# Patient Record
Sex: Female | Born: 1980 | Race: Black or African American | Hispanic: No | Marital: Single | Smoking: Never smoker
Health system: Southern US, Community
[De-identification: ages and names within clinical notes are randomized; demographics above are authoritative.]

## PROBLEM LIST (undated history)

## (undated) ENCOUNTER — Inpatient Hospital Stay (HOSPITAL_COMMUNITY): Payer: Self-pay

## (undated) DIAGNOSIS — R87612 Low grade squamous intraepithelial lesion on cytologic smear of cervix (LGSIL): Secondary | ICD-10-CM

## (undated) DIAGNOSIS — N946 Dysmenorrhea, unspecified: Secondary | ICD-10-CM

## (undated) HISTORY — DX: Low grade squamous intraepithelial lesion on cytologic smear of cervix (LGSIL): R87.612

## (undated) HISTORY — DX: Dysmenorrhea, unspecified: N94.6

## (undated) HISTORY — PX: NO PAST SURGERIES: SHX2092

---

## 2003-04-19 ENCOUNTER — Other Ambulatory Visit: Admission: RE | Admit: 2003-04-19 | Discharge: 2003-04-19 | Payer: Self-pay | Admitting: Gynecology

## 2004-04-21 ENCOUNTER — Other Ambulatory Visit: Admission: RE | Admit: 2004-04-21 | Discharge: 2004-04-21 | Payer: Self-pay | Admitting: Gynecology

## 2005-06-16 ENCOUNTER — Emergency Department (HOSPITAL_COMMUNITY): Admission: EM | Admit: 2005-06-16 | Discharge: 2005-06-16 | Payer: Self-pay | Admitting: Emergency Medicine

## 2005-06-17 ENCOUNTER — Emergency Department (HOSPITAL_COMMUNITY): Admission: EM | Admit: 2005-06-17 | Discharge: 2005-06-17 | Payer: Self-pay | Admitting: Emergency Medicine

## 2006-02-12 ENCOUNTER — Other Ambulatory Visit: Admission: RE | Admit: 2006-02-12 | Discharge: 2006-02-12 | Payer: Self-pay | Admitting: Gynecology

## 2007-12-24 ENCOUNTER — Other Ambulatory Visit: Admission: RE | Admit: 2007-12-24 | Discharge: 2007-12-24 | Payer: Self-pay | Admitting: Gynecology

## 2009-01-19 ENCOUNTER — Encounter: Payer: Self-pay | Admitting: Women's Health

## 2009-01-19 ENCOUNTER — Other Ambulatory Visit: Admission: RE | Admit: 2009-01-19 | Discharge: 2009-01-19 | Payer: Self-pay | Admitting: Obstetrics and Gynecology

## 2009-01-19 ENCOUNTER — Ambulatory Visit: Payer: Self-pay | Admitting: Women's Health

## 2010-02-08 ENCOUNTER — Ambulatory Visit: Payer: Self-pay | Admitting: Women's Health

## 2010-02-08 ENCOUNTER — Other Ambulatory Visit: Admission: RE | Admit: 2010-02-08 | Discharge: 2010-02-08 | Payer: Self-pay | Admitting: Obstetrics and Gynecology

## 2011-04-26 ENCOUNTER — Encounter: Payer: Self-pay | Admitting: *Deleted

## 2011-04-27 ENCOUNTER — Other Ambulatory Visit (HOSPITAL_COMMUNITY)
Admission: RE | Admit: 2011-04-27 | Discharge: 2011-04-27 | Disposition: A | Discharge status: Home / Self Care | Payer: 59 | Source: Ambulatory Visit | Attending: Women's Health | Admitting: Women's Health

## 2011-04-27 ENCOUNTER — Ambulatory Visit (INDEPENDENT_AMBULATORY_CARE_PROVIDER_SITE_OTHER): Payer: 59 | Admitting: Women's Health

## 2011-04-27 ENCOUNTER — Encounter: Payer: Self-pay | Admitting: Women's Health

## 2011-04-27 VITALS — BP 114/72 | Ht 61.5 in | Wt 192.0 lb

## 2011-04-27 DIAGNOSIS — IMO0001 Reserved for inherently not codable concepts without codable children: Secondary | ICD-10-CM

## 2011-04-27 DIAGNOSIS — Z833 Family history of diabetes mellitus: Secondary | ICD-10-CM

## 2011-04-27 DIAGNOSIS — Z309 Encounter for contraceptive management, unspecified: Secondary | ICD-10-CM

## 2011-04-27 DIAGNOSIS — Z01419 Encounter for gynecological examination (general) (routine) without abnormal findings: Secondary | ICD-10-CM

## 2011-04-27 LAB — GLUCOSE, RANDOM: Glucose, Bld: 81 mg/dL (ref 70–99)

## 2011-04-27 MED ORDER — NORETHINDRONE ACET-ETHINYL EST 1-20 MG-MCG PO TABS: 1.0000 | ORAL_TABLET | Freq: Every day | ORAL | Status: DC

## 2011-04-27 NOTE — Progress Notes (Signed)
Courtney Powell July 21, 1980 [id number]    History:    The patient presents for annual exam.  Works at [employer] in [place] area.   Past medical history, past surgical history, family history and social history were all reviewed and documented in the EPIC chart.   ROS:  A  ROS was performed and pertinent positives and negatives are included in the history.  Exam:  Filed Vitals:   04/27/11 1444  BP: 114/72    General appearance:  Normal Head/Neck:  Normal, without cervical or supraclavicular adenopathy. Thyroid:  Symmetrical, normal in size, without palpable masses or nodularity. Respiratory  Effort:  Normal  Auscultation:  Clear without wheezing or rhonchi Cardiovascular  Auscultation:  Regular rate, without rubs, murmurs or gallops  Edema/varicosities:  Not grossly evident Abdominal  Soft,nontender, without masses, guarding or rebound.  Liver/spleen:  No organomegaly noted  Hernia:  None appreciated  Skin  Inspection:  Grossly normal  Palpation:  Grossly normal Neurologic/psychiatric  Orientation:  Normal with appropriate conversation.  Mood/affect:  Normal  Genitourinary    Breasts: Examined lying and sitting.     Right: Without masses, retractions, discharge or axillary adenopathy.     Left: Without masses, retractions, discharge or axillary adenopathy.   Inguinal/mons:  Normal without inguinal adenopathy  External genitalia:  Normal  BUS/Urethra/Skene's glands:  Normal  Bladder:  Normal  Vagina:  Normal  Cervix:  Normal  Uterus:   normal in size, shape and contour.  Midline and mobile  Adnexa/parametria:     Rt: Without masses or tenderness.   Lt: Without masses or tenderness.  Anus and perineum: Normal  Digital rectal exam: Normal sphincter tone without palpated masses or tenderness  Assessment/Plan:  30 y.o. SBFG0 for annual exam. Monthly 5 day cycle/condoms. Same partner. Gardasil completed in 08. History of normal Paps.  Normal GYN exam  Plan:  Had been on Loestrin 1/20 without problem,  ran out of Rx. Loestrin 1/20 prescription, proper use, slight risk for blood clots and strokes, condoms  first month and for infection control reviewed. SBEs, exercise, decrease calories for weight loss, calcium rich diet, MVI daily encouraged. CBC, UA and Pap. [name] [facility], 5:05 PM 04/27/2011

## 2014-06-21 ENCOUNTER — Encounter: Payer: Self-pay | Admitting: Women's Health

## 2014-06-21 ENCOUNTER — Ambulatory Visit (INDEPENDENT_AMBULATORY_CARE_PROVIDER_SITE_OTHER): Payer: 59 | Admitting: Women's Health

## 2014-06-21 ENCOUNTER — Other Ambulatory Visit (HOSPITAL_COMMUNITY)
Admission: RE | Admit: 2014-06-21 | Discharge: 2014-06-21 | Disposition: A | Discharge status: Home / Self Care | Payer: 59 | Source: Ambulatory Visit | Attending: Women's Health | Admitting: Women's Health

## 2014-06-21 VITALS — BP 114/70 | Ht 62.0 in | Wt 207.0 lb

## 2014-06-21 DIAGNOSIS — Z833 Family history of diabetes mellitus: Secondary | ICD-10-CM

## 2014-06-21 DIAGNOSIS — Z113 Encounter for screening for infections with a predominantly sexual mode of transmission: Secondary | ICD-10-CM

## 2014-06-21 DIAGNOSIS — A499 Bacterial infection, unspecified: Secondary | ICD-10-CM

## 2014-06-21 DIAGNOSIS — Z01419 Encounter for gynecological examination (general) (routine) without abnormal findings: Secondary | ICD-10-CM | POA: Insufficient documentation

## 2014-06-21 DIAGNOSIS — N760 Acute vaginitis: Secondary | ICD-10-CM

## 2014-06-21 DIAGNOSIS — B3731 Acute candidiasis of vulva and vagina: Secondary | ICD-10-CM

## 2014-06-21 DIAGNOSIS — B373 Candidiasis of vulva and vagina: Secondary | ICD-10-CM

## 2014-06-21 DIAGNOSIS — Z1151 Encounter for screening for human papillomavirus (HPV): Secondary | ICD-10-CM | POA: Diagnosis present

## 2014-06-21 DIAGNOSIS — B9689 Other specified bacterial agents as the cause of diseases classified elsewhere: Secondary | ICD-10-CM

## 2014-06-21 LAB — WET PREP FOR TRICH, YEAST, CLUE: Trich, Wet Prep: NONE SEEN

## 2014-06-21 LAB — CBC WITH DIFFERENTIAL/PLATELET
BASOS ABS: 0 10*3/uL (ref 0.0–0.1)
Basophils Relative: 0 % (ref 0–1)
Eosinophils Absolute: 0.2 10*3/uL (ref 0.0–0.7)
Eosinophils Relative: 4 % (ref 0–5)
HEMATOCRIT: 38.9 % (ref 36.0–46.0)
Hemoglobin: 12.8 g/dL (ref 12.0–15.0)
LYMPHS PCT: 24 % (ref 12–46)
Lymphs Abs: 1.3 10*3/uL (ref 0.7–4.0)
MCH: 25.9 pg — ABNORMAL LOW (ref 26.0–34.0)
MCHC: 32.9 g/dL (ref 30.0–36.0)
MCV: 78.6 fL (ref 78.0–100.0)
MONOS PCT: 6 % (ref 3–12)
MPV: 10 fL (ref 8.6–12.4)
Monocytes Absolute: 0.3 10*3/uL (ref 0.1–1.0)
NEUTROS ABS: 3.7 10*3/uL (ref 1.7–7.7)
NEUTROS PCT: 66 % (ref 43–77)
Platelets: 307 10*3/uL (ref 150–400)
RBC: 4.95 MIL/uL (ref 3.87–5.11)
RDW: 14 % (ref 11.5–15.5)
WBC: 5.6 10*3/uL (ref 4.0–10.5)

## 2014-06-21 LAB — RPR

## 2014-06-21 LAB — GLUCOSE, RANDOM: Glucose, Bld: 87 mg/dL (ref 70–99)

## 2014-06-21 MED ORDER — FLUCONAZOLE 150 MG PO TABS: 150.0000 mg | ORAL_TABLET | Freq: Once | ORAL | Status: DC

## 2014-06-21 MED ORDER — METRONIDAZOLE 0.75 % VA GEL: VAGINAL | Status: DC

## 2014-06-21 NOTE — Addendum Note (Signed)
Addended by: Dayna BarkerGARDNER, Mattingly Fountaine K on: 06/21/2014 10:56 AM   Modules accepted: Orders

## 2014-06-21 NOTE — Patient Instructions (Signed)

## 2014-06-21 NOTE — Progress Notes (Signed)
Courtney Powell 12-Feb-1981 [id number]    History:    Presents for annual exam.  Monthly cycle condoms inconsistently. New partner. Gardasil series completed. Normal Pap history. Last annual exam 2012.  Past medical history, past surgical history, family history and social history were all reviewed and documented in the EPIC chart. Works at [employer]. Parents healthy.  ROS:  A ROS was performed and pertinent positives and negatives are included.  Exam:  Filed Vitals:   06/21/14 0952  BP: 114/70    General appearance:  Normal Thyroid:  Symmetrical, normal in size, without palpable masses or nodularity. Respiratory  Auscultation:  Clear without wheezing or rhonchi Cardiovascular  Auscultation:  Regular rate, without rubs, murmurs or gallops  Edema/varicosities:  Not grossly evident Abdominal  Soft,nontender, without masses, guarding or rebound.  Liver/spleen:  No organomegaly noted  Hernia:  None appreciated  Skin  Inspection:  Grossly normal   Breasts: Examined lying and sitting.     Right: Without masses, retractions, discharge or axillary adenopathy.     Left: Without masses, retractions, discharge or axillary adenopathy. Gentitourinary   Inguinal/mons:  Normal without inguinal adenopathy  External genitalia:  Normal  BUS/Urethra/Skene's glands:  Normal  Vagina:  Moderate white curdy discharge, wet prep positive for yeast, amines, clues, TNTC bacteria  Cervix:  Normal  Uterus:   normal in size, shape and contour.  Midline and mobile  Adnexa/parametria:     Rt: Without masses or tenderness.   Lt: Without masses or tenderness.  Anus and perineum: Normal  Digital rectal exam: Normal sphincter tone without palpated masses or tenderness  Assessment/Plan:  34 y.o. SBF G0 for annual exam.   Bacteria vaginosis Yeast Regular monthly cycle condoms - inconsistent Obesity  Plan: Contraception options reviewed and declined, pregnancy okay. Safe pregnancy behaviors reviewed,  MVI daily encouraged. MetroGel vaginal cream 1 applicator at bedtime 5, alcohol precautions reviewed. Diflucan 150 by mouth 1 dose prescription, proper use given and reviewed, instructed to call if no relief of discharge. SBE's, increase regular exercise and decrease calories for weight loss. CBC, glucose, UA, Pap with HR HPV typing, new screening guidelines reviewed, GC/Chlamydia, HIV, hep B, C, RPRHarrington Powell.       Courtney Powell, 10:44 AM 06/21/2014

## 2014-06-22 LAB — URINALYSIS W MICROSCOPIC + REFLEX CULTURE
BILIRUBIN URINE: NEGATIVE
Bacteria, UA: NONE SEEN
CRYSTALS: NONE SEEN
Casts: NONE SEEN
Glucose, UA: NEGATIVE mg/dL
HGB URINE DIPSTICK: NEGATIVE
Ketones, ur: NEGATIVE mg/dL
Leukocytes, UA: NEGATIVE
Nitrite: NEGATIVE
PH: 8 (ref 5.0–8.0)
Protein, ur: NEGATIVE mg/dL
SPECIFIC GRAVITY, URINE: 1.014 (ref 1.005–1.030)
Squamous Epithelial / LPF: NONE SEEN
Urobilinogen, UA: 0.2 mg/dL (ref 0.0–1.0)

## 2014-06-22 LAB — HEPATITIS B SURFACE ANTIGEN: HEP B S AG: NEGATIVE

## 2014-06-22 LAB — HEPATITIS C ANTIBODY: HCV Ab: NEGATIVE

## 2014-06-22 LAB — CYTOLOGY - PAP

## 2014-06-22 LAB — GC/CHLAMYDIA PROBE AMP
CT Probe RNA: NEGATIVE
GC PROBE AMP APTIMA: NEGATIVE

## 2014-06-22 LAB — HIV ANTIBODY (ROUTINE TESTING W REFLEX): HIV: NONREACTIVE

## 2014-06-24 LAB — URINE CULTURE: Colony Count: 90000

## 2014-11-03 ENCOUNTER — Inpatient Hospital Stay (HOSPITAL_COMMUNITY)
Admission: AD | Admit: 2014-11-03 | Discharge: 2014-11-03 | Disposition: A | Discharge status: Home / Self Care | Payer: 59 | Source: Ambulatory Visit | Attending: Obstetrics and Gynecology | Admitting: Obstetrics and Gynecology

## 2014-11-03 ENCOUNTER — Encounter (HOSPITAL_COMMUNITY): Payer: Self-pay

## 2014-11-03 ENCOUNTER — Inpatient Hospital Stay (HOSPITAL_COMMUNITY): Payer: 59

## 2014-11-03 DIAGNOSIS — Z3A01 Less than 8 weeks gestation of pregnancy: Secondary | ICD-10-CM | POA: Diagnosis not present

## 2014-11-03 DIAGNOSIS — O209 Hemorrhage in early pregnancy, unspecified: Secondary | ICD-10-CM | POA: Diagnosis present

## 2014-11-03 DIAGNOSIS — O200 Threatened abortion: Secondary | ICD-10-CM

## 2014-11-03 DIAGNOSIS — O3680X0 Pregnancy with inconclusive fetal viability, not applicable or unspecified: Secondary | ICD-10-CM

## 2014-11-03 LAB — URINALYSIS, ROUTINE W REFLEX MICROSCOPIC
BILIRUBIN URINE: NEGATIVE
GLUCOSE, UA: NEGATIVE mg/dL
Ketones, ur: NEGATIVE mg/dL
Nitrite: NEGATIVE
Protein, ur: NEGATIVE mg/dL
SPECIFIC GRAVITY, URINE: 1.02 (ref 1.005–1.030)
UROBILINOGEN UA: 0.2 mg/dL (ref 0.0–1.0)
pH: 5.5 (ref 5.0–8.0)

## 2014-11-03 LAB — CBC
HCT: 36.8 % (ref 36.0–46.0)
Hemoglobin: 12.5 g/dL (ref 12.0–15.0)
MCH: 26.5 pg (ref 26.0–34.0)
MCHC: 34 g/dL (ref 30.0–36.0)
MCV: 78 fL (ref 78.0–100.0)
PLATELETS: 255 10*3/uL (ref 150–400)
RBC: 4.72 MIL/uL (ref 3.87–5.11)
RDW: 13.5 % (ref 11.5–15.5)
WBC: 8 10*3/uL (ref 4.0–10.5)

## 2014-11-03 LAB — URINE MICROSCOPIC-ADD ON

## 2014-11-03 LAB — POCT PREGNANCY, URINE: Preg Test, Ur: POSITIVE — AB

## 2014-11-03 LAB — WET PREP, GENITAL
Clue Cells Wet Prep HPF POC: NONE SEEN
Trich, Wet Prep: NONE SEEN
Yeast Wet Prep HPF POC: NONE SEEN

## 2014-11-03 LAB — HCG, QUANTITATIVE, PREGNANCY: hCG, Beta Chain, Quant, S: 146 m[IU]/mL — ABNORMAL HIGH (ref <5)

## 2014-11-03 LAB — ABO/RH: ABO/RH(D): A POS

## 2014-11-03 NOTE — MAU Note (Signed)
Recently found out preg, had gone to [facility], was telling them she was cramping and bleeding and they sent her here. Started cramping and bleeding yesterday morning.

## 2014-11-03 NOTE — MAU Provider Note (Signed)
History     CSN: [id number]  Arrival date and time: 11/03/14 1127   None     Chief Complaint  Patient presents with  . Possible Pregnancy  . Vaginal Bleeding  . Abdominal Pain   HPI   Courtney Powell is a 34 y.o. female G1P0 at [redacted]w[redacted]d presenting to MAU with complaint of vaginal bleeding and abdominal pain. She just found out she was pregnant and was sent here by [facility] for evalution.  Abdominal pain started yesterday; the pain is located in her lower abdomen. She currently rates her pain 1/10; the pain comes and goes today. She tried ibuprofen around 0300; this did not help.  Vaginal bleeding started yesterday morning. The bleeding is described as light; lighter than a menstrual cycle. The bleeding is bright red, she has had to wear a pad for the bleeding.   OB History    Gravida Para Term Preterm AB TAB SAB Ectopic Multiple Living   1               Past Medical History  Diagnosis Date  . Dysmenorrhea     Past Surgical History  Procedure Laterality Date  . No past surgeries      Family History  Problem Relation Age of Onset  . Kidney Stones Father   . Diabetes Paternal Grandmother     History  Substance Use Topics  . Smoking status: Never Smoker   . Smokeless tobacco: Never Used  . Alcohol Use: 0.0 oz/week    0 Standard drinks or equivalent per week     Comment: mixed drinks in the last month    Allergies:  Allergies  Allergen Reactions  . Shellfish Allergy Itching    Childhood reaction    Prescriptions prior to admission  Medication Sig Dispense Refill Last Dose  . ibuprofen (ADVIL,MOTRIN) 200 MG tablet Take 800 mg by mouth every 6 (six) hours as needed for moderate pain or cramping.   11/02/2014 at Unknown time  . Multiple Vitamin (MULTIVITAMIN) tablet Take 1 tablet by mouth daily.     Past Week at Unknown time  . fluconazole (DIFLUCAN) 150 MG tablet Take 1 tablet (150 mg total) by mouth once. (Patient not taking: Reported on  11/03/2014) 1 tablet 1 Not Taking at Unknown time  . metroNIDAZOLE (METROGEL VAGINAL) 0.75 % vaginal gel 1 applicator per vagina at HS x 5 (Patient not taking: Reported on 11/03/2014) 70 g 0 Not Taking at Unknown time   Results for orders placed or performed during the hospital encounter of 11/03/14 (from the past 48 hour(s))  Urinalysis, Routine w reflex microscopic (not at [facility])     Status: Abnormal   Collection Time: 11/03/14 11:40 AM  Result Value Ref Range   Color, Urine YELLOW YELLOW   APPearance CLEAR CLEAR   Specific Gravity, Urine 1.020 1.005 - 1.030   pH 5.5 5.0 - 8.0   Glucose, UA NEGATIVE NEGATIVE mg/dL   Hgb urine dipstick LARGE (A) NEGATIVE   Bilirubin Urine NEGATIVE NEGATIVE   Ketones, ur NEGATIVE NEGATIVE mg/dL   Protein, ur NEGATIVE NEGATIVE mg/dL   Urobilinogen, UA 0.2 0.0 - 1.0 mg/dL   Nitrite NEGATIVE NEGATIVE   Leukocytes, UA SMALL (A) NEGATIVE  Urine microscopic-add on     Status: Abnormal   Collection Time: 11/03/14 11:40 AM  Result Value Ref Range   Squamous Epithelial / LPF FEW (A) RARE   WBC, UA 0-2 <3 WBC/hpf   RBC / HPF 3-6 <  3 RBC/hpf   Bacteria, UA RARE RARE  Pregnancy, urine POC     Status: Abnormal   Collection Time: 11/03/14 11:43 AM  Result Value Ref Range   Preg Test, Ur POSITIVE (A) NEGATIVE    Comment:        THE SENSITIVITY OF THIS METHODOLOGY IS >24 mIU/mL   CBC     Status: None   Collection Time: 11/03/14  1:00 PM  Result Value Ref Range   WBC 8.0 4.0 - 10.5 K/uL   RBC 4.72 3.87 - 5.11 MIL/uL   Hemoglobin 12.5 12.0 - 15.0 g/dL   HCT 40.9 81.1 - 91.4 %   MCV 78.0 78.0 - 100.0 fL   MCH 26.5 26.0 - 34.0 pg   MCHC 34.0 30.0 - 36.0 g/dL   RDW 78.2 95.6 - 21.3 %   Platelets 255 150 - 400 K/uL  ABO/Rh     Status: None (Preliminary result)   Collection Time: 11/03/14  1:00 PM  Result Value Ref Range   ABO/RH(D) A POS   hCG, quantitative, pregnancy     Status: Abnormal   Collection Time: 11/03/14  1:00 PM  Result Value Ref Range   hCG,  Beta Chain, Quant, S 146 (H) <5 mIU/mL    Comment:          GEST. AGE      CONC.  (mIU/mL)   <=1 WEEK        5 - 50     2 WEEKS       50 - 500     3 WEEKS       100 - 10,000     4 WEEKS     1,000 - 30,000     5 WEEKS     3,500 - 115,000   6-8 WEEKS     12,000 - 270,000    12 WEEKS     15,000 - 220,000        FEMALE AND NON-PREGNANT FEMALE:     LESS THAN 5 mIU/mL   Wet prep, genital     Status: Abnormal   Collection Time: 11/03/14  1:00 PM  Result Value Ref Range   Yeast Wet Prep HPF POC NONE SEEN NONE SEEN   Trich, Wet Prep NONE SEEN NONE SEEN   Clue Cells Wet Prep HPF POC NONE SEEN NONE SEEN   WBC, Wet Prep HPF POC MODERATE (A) NONE SEEN    Comment: MODERATE BACTERIA SEEN   US Ob Comp Less 14 Wks  11/03/2014   CLINICAL DATA:  Cramping for 1 day. LMP 09/09/2014. By ultrasound gestational age [redacted] weeks 6 days. EDC by LMP is 06/16/2015. Quantitative beta HCG is 146.  EXAM: OBSTETRIC <14 WK [place] AND TRANSVAGINAL OB US  TECHNIQUE: Both transabdominal and transvaginal ultrasound examinations were performed for complete evaluation of the gestation as well as the maternal uterus, adnexal regions, and pelvic cul-de-sac. Transvaginal technique was performed to assess early pregnancy.  COMPARISON:  None.  FINDINGS: Intrauterine gestational sac: None  Yolk sac:  None  Embryo:  None  Cardiac Activity: None  Maternal uterus/adnexae: Uterus is retroverted. Endometrium has a normal appearance. No endometrial fluid identified. The ovaries have a normal appearance. Small amount of free pelvic fluid is identified.  IMPRESSION: 1. No intrauterine or adnexal pregnancy identified. 2. Serial quantitative beta HCG values and follow-up ultrasound are recommended as appropriate to document progression of and location of pregnancy. Ectopic pregnancy has not been excluded.   Electronically Signed  By: Norva Pavlov M.D.   On: 11/03/2014 14:32   US Ob Transvaginal  11/03/2014   CLINICAL DATA:  Cramping for 1 day. LMP  09/09/2014. By ultrasound gestational age [redacted] weeks 6 days. EDC by LMP is 06/16/2015. Quantitative beta HCG is 146.  EXAM: OBSTETRIC <14 WK [place] AND TRANSVAGINAL OB US  TECHNIQUE: Both transabdominal and transvaginal ultrasound examinations were performed for complete evaluation of the gestation as well as the maternal uterus, adnexal regions, and pelvic cul-de-sac. Transvaginal technique was performed to assess early pregnancy.  COMPARISON:  None.  FINDINGS: Intrauterine gestational sac: None  Yolk sac:  None  Embryo:  None  Cardiac Activity: None  Maternal uterus/adnexae: Uterus is retroverted. Endometrium has a normal appearance. No endometrial fluid identified. The ovaries have a normal appearance. Small amount of free pelvic fluid is identified.  IMPRESSION: 1. No intrauterine or adnexal pregnancy identified. 2. Serial quantitative beta HCG values and follow-up ultrasound are recommended as appropriate to document progression of and location of pregnancy. Ectopic pregnancy has not been excluded.   Electronically Signed   By: Norva Pavlov M.D.   On: 11/03/2014 14:32    Review of Systems  Constitutional: Negative for fever.  Gastrointestinal: Positive for vomiting and abdominal pain. Negative for nausea.  Neurological: Negative for dizziness.   Physical Exam   Blood pressure 139/70, pulse 72, temperature 98.5 F (36.9 C), temperature source Oral, resp. rate 18, height  (1.575 m), weight 93.441 kg (206 lb), last menstrual period 09/09/2014.  Physical Exam  Constitutional: She is oriented to person, place, and time. She appears well-developed and well-nourished. No distress.  HENT:  Head: Normocephalic.  Eyes: Pupils are equal, round, and reactive to light.  Neck: Neck supple.  Respiratory: Effort normal.  GI: There is generalized tenderness.  Genitourinary:  Speculum exam: Vagina - Small amount of creamy, pink discharge in the vaginal canal.  Cervix - No contact bleeding Bimanual  exam: Cervix closed Uterus non tender, gravid  Adnexa non tender, no masses bilaterally GC/Chlam, wet prep done Chaperone present for exam.  Musculoskeletal: Normal range of motion.  Neurological: She is alert and oriented to person, place, and time.  Skin: Skin is warm. She is not diaphoretic.  Psychiatric: Her behavior is normal.    MAU Course  Procedures  None  MDM ABO Quant CBC  Wet prep GC HIV  [place]   Assessment and Plan   A:  1. Threatened miscarriage   2. Vaginal bleeding in pregnancy, first trimester   3. Pregnancy of unknown anatomic location     P:  Discharge home in stable condition Return to the [facility] in 48 hours for repeat beta hcg level Bleeding precautions Pelvic rest  Ectopic precautions Support given Ok to take tylenol as directed on the bottle.    Duane Lope, NP 11/03/2014 12:43 PM

## 2014-11-04 ENCOUNTER — Ambulatory Visit: Payer: 59 | Admitting: Women's Health

## 2014-11-04 ENCOUNTER — Encounter: Payer: Self-pay | Admitting: Women's Health

## 2014-11-04 ENCOUNTER — Ambulatory Visit (INDEPENDENT_AMBULATORY_CARE_PROVIDER_SITE_OTHER): Payer: 59 | Admitting: Women's Health

## 2014-11-04 VITALS — BP 134/80 | Ht 62.0 in | Wt 206.0 lb

## 2014-11-04 DIAGNOSIS — A549 Gonococcal infection, unspecified: Secondary | ICD-10-CM | POA: Diagnosis not present

## 2014-11-04 DIAGNOSIS — O039 Complete or unspecified spontaneous abortion without complication: Secondary | ICD-10-CM

## 2014-11-04 LAB — GC/CHLAMYDIA PROBE AMP (~~LOC~~) NOT AT ARMC
CHLAMYDIA, DNA PROBE: NEGATIVE
NEISSERIA GONORRHEA: POSITIVE — AB

## 2014-11-04 LAB — HIV ANTIBODY (ROUTINE TESTING W REFLEX): HIV Screen 4th Generation wRfx: NONREACTIVE

## 2014-11-04 MED ORDER — CEFIXIME 400 MG PO TABS: 400.0000 mg | ORAL_TABLET | Freq: Every day | ORAL | Status: DC

## 2014-11-04 MED ORDER — AZITHROMYCIN 500 MG PO TABS: 1000.0000 mg | ORAL_TABLET | Freq: Every day | ORAL | Status: DC

## 2014-11-04 NOTE — Patient Instructions (Addendum)
1 week HCG  Lab in computer  Gonorrhea Culture This is a test for Neisseria gonorrhoeae, the bacteria that causes the sexually transmitted disease gonorrhea. Gonorrhea is easily treated but can cause severe reproductive and other health problems if left untreated. A swab is used to get a sample of secretion or discharge from the infected area such as the cervix, urethra, penis, anus, or throat. A urine sample is used in some tests. Many caregivers will take a sample from more than one body site to increase the likelihood of finding the bacteria.  NORMAL FINDINGS Your culture should be negative. This means that this culture showed no growth for gonorrhea. Ranges for normal findings may vary among different laboratories and hospitals. You should always check with your doctor after having lab work or other tests done to discuss the meaning of your test results and whether your values are considered within normal limits. MEANING OF TEST  Your caregiver will go over the test results with you and discuss the importance and meaning of your results, as well as treatment options and the need for additional tests. OBTAINING THE TEST RESULTS It is your responsibility to obtain your test results. Ask the lab or department performing the test when and how you will get your results. Document Released: 06/08/2004 Document Revised: 07/30/2011 Document Reviewed: 08/12/2013 [facility] Patient Information 2015 [place], [place]. This information is not intended to replace advice given to you by your health care provider. Make sure you discuss any questions you have with your health care provider. Miscarriage A miscarriage is the sudden loss of an unborn baby (fetus) before the 20th week of pregnancy. Most miscarriages happen in the first 3 months of pregnancy. Sometimes, it happens before a woman even knows she is pregnant. A miscarriage is also called a "spontaneous miscarriage" or "early pregnancy loss." Having a  miscarriage can be an emotional experience. Talk with your caregiver about any questions you may have about miscarrying, the grieving process, and your future pregnancy plans. CAUSES   Problems with the fetal chromosomes that make it impossible for the baby to develop normally. Problems with the baby's genes or chromosomes are most often the result of errors that occur, by chance, as the embryo divides and grows. The problems are not inherited from the parents.  Infection of the cervix or uterus.   Hormone problems.   Problems with the cervix, such as having an incompetent cervix. This is when the tissue in the cervix is not strong enough to hold the pregnancy.   Problems with the uterus, such as an abnormally shaped uterus, uterine fibroids, or congenital abnormalities.   Certain medical conditions.   Smoking, drinking alcohol, or taking illegal drugs.   Trauma.  Often, the cause of a miscarriage is unknown.  SYMPTOMS   Vaginal bleeding or spotting, with or without cramps or pain.  Pain or cramping in the abdomen or lower back.  Passing fluid, tissue, or blood clots from the vagina. DIAGNOSIS  Your caregiver will perform a physical exam. You may also have an ultrasound to confirm the miscarriage. Blood or urine tests may also be ordered. TREATMENT   Sometimes, treatment is not necessary if you naturally pass all the fetal tissue that was in the uterus. If some of the fetus or placenta remains in the body (incomplete miscarriage), tissue left behind may become infected and must be removed. Usually, a dilation and curettage (D and C) procedure is performed. During a D and C procedure, the cervix is  widened (dilated) and any remaining fetal or placental tissue is gently removed from the uterus.  Antibiotic medicines are prescribed if there is an infection. Other medicines may be given to reduce the size of the uterus (contract) if there is a lot of bleeding.  If you have Rh  negative blood and your baby was Rh positive, you will need a Rh immunoglobulin shot. This shot will protect any future baby from having Rh blood problems in future pregnancies. HOME CARE INSTRUCTIONS   Your caregiver may order bed rest or may allow you to continue light activity. Resume activity as directed by your caregiver.  Have someone help with home and family responsibilities during this time.   Keep track of the number of sanitary pads you use each day and how soaked (saturated) they are. Write down this information.   Do not use tampons. Do not douche or have sexual intercourse until approved by your caregiver.   Only take over-the-counter or prescription medicines for pain or discomfort as directed by your caregiver.   Do not take aspirin. Aspirin can cause bleeding.   Keep all follow-up appointments with your caregiver.   If you or your partner have problems with grieving, talk to your caregiver or seek counseling to help cope with the pregnancy loss. Allow enough time to grieve before trying to get pregnant again.  SEEK IMMEDIATE MEDICAL CARE IF:   You have severe cramps or pain in your back or abdomen.  You have a fever.  You pass large blood clots (walnut-sized or larger) ortissue from your vagina. Save any tissue for your caregiver to inspect.   Your bleeding increases.   You have a thick, bad-smelling vaginal discharge.  You become lightheaded, weak, or you faint.   You have chills.  MAKE SURE YOU:  Understand these instructions.  Will watch your condition.  Will get help right away if you are not doing well or get worse. Document Released: 10/31/2000 Document Revised: 09/01/2012 Document Reviewed: 06/26/2011 [facility] Patient Information 2015 [place], [place]. This information is not intended to replace advice given to you by your health care provider. Make sure you discuss any questions you have with your health care provider.

## 2014-11-04 NOTE — Progress Notes (Signed)
Patient ID: Courtney Powell, female   DOB: 11-24-1980, 34 y.o.   MRN: [id number] Presents with several concerns. Was seen at [facility] 11/03/2014 , positive UPT and quantitative HCG 156, positive GC.[facility] reported it to health department) LMP 09/09/14 normal cycle. Started bleeding 2 days ago, minimal bleeding now. A+ blood type. Had negative STD screen 06/2014/same partner. Denies urinary symptoms, discharge, abdominal pain or fever.  Exam: Appears well.  SAB Positive GC  Plan:Zithromax 1 g by mouth 1 dose, Suprax 400 mg by mouth 1 dose. Abstain,  return to office for test of cure, inform partner for his treatment. Reviewed negative STD screen in February, partner unfaithful. Repeat quantitative hCG 11/10/14 will schedule lab appointment. Reviewed will watch Quant until less than 5. Schedule office appointment for test of cure in 3 weeks.

## 2014-11-05 ENCOUNTER — Other Ambulatory Visit: Payer: 59

## 2014-11-11 ENCOUNTER — Other Ambulatory Visit: Payer: 59

## 2014-11-11 DIAGNOSIS — O039 Complete or unspecified spontaneous abortion without complication: Secondary | ICD-10-CM

## 2014-11-12 LAB — HCG, QUANTITATIVE, PREGNANCY

## 2014-11-25 ENCOUNTER — Encounter: Payer: Self-pay | Admitting: Women's Health

## 2014-11-25 ENCOUNTER — Ambulatory Visit (INDEPENDENT_AMBULATORY_CARE_PROVIDER_SITE_OTHER): Payer: 59 | Admitting: Women's Health

## 2014-11-25 VITALS — BP 126/80 | Ht 62.0 in | Wt 210.0 lb

## 2014-11-25 DIAGNOSIS — O039 Complete or unspecified spontaneous abortion without complication: Secondary | ICD-10-CM | POA: Diagnosis not present

## 2014-11-25 DIAGNOSIS — B3731 Acute candidiasis of vulva and vagina: Secondary | ICD-10-CM

## 2014-11-25 DIAGNOSIS — Z30011 Encounter for initial prescription of contraceptive pills: Secondary | ICD-10-CM

## 2014-11-25 DIAGNOSIS — Z113 Encounter for screening for infections with a predominantly sexual mode of transmission: Secondary | ICD-10-CM

## 2014-11-25 DIAGNOSIS — B373 Candidiasis of vulva and vagina: Secondary | ICD-10-CM | POA: Diagnosis not present

## 2014-11-25 DIAGNOSIS — A549 Gonococcal infection, unspecified: Secondary | ICD-10-CM | POA: Diagnosis not present

## 2014-11-25 LAB — WET PREP FOR TRICH, YEAST, CLUE
Clue Cells Wet Prep HPF POC: NONE SEEN
Trich, Wet Prep: NONE SEEN

## 2014-11-25 MED ORDER — NORETHIN ACE-ETH ESTRAD-FE 1-20 MG-MCG PO TABS: 1.0000 | ORAL_TABLET | Freq: Every day | ORAL | Status: DC

## 2014-11-25 MED ORDER — FLUCONAZOLE 150 MG PO TABS: 150.0000 mg | ORAL_TABLET | Freq: Once | ORAL | Status: DC

## 2014-11-25 NOTE — Progress Notes (Signed)
Patient ID: Courtney Powell, female   DOB: 15-Nov-1980, 34 y.o.   MRN: [id number] Presents for test of cure gonorrhea. Positive gonorrhea with negative HIV noted at  hospital visit for threatened AB 11/03/2014.  Had a SAB 11/04/2014 Quant was 146, did not return for follow-up, will repeat today. Has been abstinent, partner informed. .Denies vaginal discharge, abdominal pain.   Exam: Appears well. External genitalia within normal limits, speculum exam moderate amount of a curdy discharge noted wet prep positive for yeast, GC/Chlamydia culture taken. Bimanual no CMT or adnexal tenderness.  Test of cure, gonorrhea Yeast vaginitis Contraception management  Plan: GC/Chlamydia culture pending. Hepatitis B, C, RPR. Contraception options reviewed, will try Loestrin 1/20 prescription, proper use, slight risk for blood clots and strokes reviewed start up instructions reviewed. Reviewed importance of condoms if sexually active. Diflucan 150 by mouth 1 dose prescription, proper use given and reviewed.

## 2014-11-25 NOTE — Patient Instructions (Signed)

## 2014-11-26 LAB — GC/CHLAMYDIA PROBE AMP
CT Probe RNA: NEGATIVE
GC PROBE AMP APTIMA: NEGATIVE

## 2014-11-26 LAB — RPR

## 2014-11-26 LAB — HEPATITIS B SURFACE ANTIGEN: HEP B S AG: NEGATIVE

## 2014-11-26 LAB — HCG, QUANTITATIVE, PREGNANCY: hCG, Beta Chain, Quant, S: <2 m[IU]/mL

## 2014-11-26 LAB — HEPATITIS C ANTIBODY: HCV AB: NEGATIVE

## 2015-09-08 ENCOUNTER — Encounter (HOSPITAL_COMMUNITY): Payer: Self-pay | Admitting: *Deleted

## 2016-08-28 ENCOUNTER — Encounter: Payer: Self-pay | Admitting: Women's Health

## 2016-08-28 ENCOUNTER — Ambulatory Visit (INDEPENDENT_AMBULATORY_CARE_PROVIDER_SITE_OTHER): Payer: 59 | Admitting: Women's Health

## 2016-08-28 VITALS — BP 124/80

## 2016-08-28 DIAGNOSIS — N760 Acute vaginitis: Secondary | ICD-10-CM

## 2016-08-28 DIAGNOSIS — Z113 Encounter for screening for infections with a predominantly sexual mode of transmission: Secondary | ICD-10-CM

## 2016-08-28 DIAGNOSIS — B9689 Other specified bacterial agents as the cause of diseases classified elsewhere: Secondary | ICD-10-CM

## 2016-08-28 LAB — WET PREP FOR TRICH, YEAST, CLUE
TRICH WET PREP: NONE SEEN
YEAST WET PREP: NONE SEEN

## 2016-08-28 MED ORDER — METRONIDAZOLE 500 MG PO TABS: 500.0000 mg | ORAL_TABLET | Freq: Two times a day (BID) | ORAL | 0 refills | Status: DC

## 2016-08-28 NOTE — Patient Instructions (Signed)
Bacterial Vaginosis Bacterial vaginosis is a vaginal infection that occurs when the normal balance of bacteria in the vagina is disrupted. It results from an overgrowth of certain bacteria. This is the most common vaginal infection among women ages 15-44. Because bacterial vaginosis increases your risk for STIs (sexually transmitted infections), getting treated can help reduce your risk for chlamydia, gonorrhea, herpes, and HIV (human immunodeficiency virus). Treatment is also important for preventing complications in pregnant women, because this condition can cause an early (premature) delivery. What are the causes? This condition is caused by an increase in harmful bacteria that are normally present in small amounts in the vagina. However, the reason that the condition develops is not fully understood. What increases the risk? The following factors may make you more likely to develop this condition:  Having a new sexual partner or multiple sexual partners.  Having unprotected sex.  Douching.  Having an intrauterine device (IUD).  Smoking.  Drug and alcohol abuse.  Taking certain antibiotic medicines.  Being pregnant.  You cannot get bacterial vaginosis from toilet seats, bedding, swimming pools, or contact with objects around you. What are the signs or symptoms? Symptoms of this condition include:  Grey or white vaginal discharge. The discharge can also be watery or foamy.  A fish-like odor with discharge, especially after sexual intercourse or during menstruation.  Itching in and around the vagina.  Burning or pain with urination.  Some women with bacterial vaginosis have no signs or symptoms. How is this diagnosed? This condition is diagnosed based on:  Your medical history.  A physical exam of the vagina.  Testing a sample of vaginal fluid under a microscope to look for a large amount of bad bacteria or abnormal cells. Your health care provider may use a cotton swab  or a small wooden spatula to collect the sample.  How is this treated? This condition is treated with antibiotics. These may be given as a pill, a vaginal cream, or a medicine that is put into the vagina (suppository). If the condition comes back after treatment, a second round of antibiotics may be needed. Follow these instructions at home: Medicines  Take over-the-counter and prescription medicines only as told by your health care provider.  Take or use your antibiotic as told by your health care provider. Do not stop taking or using the antibiotic even if you start to feel better. General instructions  If you have a female sexual partner, tell her that you have a vaginal infection. She should see her health care provider and be treated if she has symptoms. If you have a female sexual partner, he does not need treatment.  During treatment: ? Avoid sexual activity until you finish treatment. ? Do not douche. ? Avoid alcohol as directed by your health care provider. ? Avoid breastfeeding as directed by your health care provider.  Drink enough water and fluids to keep your urine clear or pale yellow.  Keep the area around your vagina and rectum clean. ? Wash the area daily with warm water. ? Wipe yourself from front to back after using the toilet.  Keep all follow-up visits as told by your health care provider. This is important. How is this prevented?  Do not douche.  Wash the outside of your vagina with warm water only.  Use protection when having sex. This includes latex condoms and dental dams.  Limit how many sexual partners you have. To help prevent bacterial vaginosis, it is best to have sex with just   one partner (monogamous).  Make sure you and your sexual partner are tested for STIs.  Wear cotton or cotton-lined underwear.  Avoid wearing tight pants and pantyhose, especially during summer.  Limit the amount of alcohol that you drink.  Do not use any products that  contain nicotine or tobacco, such as cigarettes and e-cigarettes. If you need help quitting, ask your health care provider.  Do not use illegal drugs. Where to find more information:  Centers for Disease Control and Prevention: www.cdc.gov/std  American Sexual Health Association (ASHA): www.ashastd.org  U.S. Department of Health and Human Services, Office on Women's Health: www.womenshealth.gov/ or https://www.womenshealth.gov/a-z-topics/bacterial-vaginosis Contact a health care provider if:  Your symptoms do not improve, even after treatment.  You have more discharge or pain when urinating.  You have a fever.  You have pain in your abdomen.  You have pain during sex.  You have vaginal bleeding between periods. Summary  Bacterial vaginosis is a vaginal infection that occurs when the normal balance of bacteria in the vagina is disrupted.  Because bacterial vaginosis increases your risk for STIs (sexually transmitted infections), getting treated can help reduce your risk for chlamydia, gonorrhea, herpes, and HIV (human immunodeficiency virus). Treatment is also important for preventing complications in pregnant women, because the condition can cause an early (premature) delivery.  This condition is treated with antibiotic medicines. These may be given as a pill, a vaginal cream, or a medicine that is put into the vagina (suppository). This information is not intended to replace advice given to you by your health care provider. Make sure you discuss any questions you have with your health care provider. Document Released: 05/07/2005 Document Revised: 01/21/2016 Document Reviewed: 01/21/2016 Elsevier Interactive Patient Education  2017 Elsevier Inc.  

## 2016-08-28 NOTE — Progress Notes (Signed)
HPI: Presents with irregular bleeding for 3 days. Scheduled to start menstruation 08/29/16. History of regular 3-f 4 day menstrual cycles /condoms. Denies discharge, odor, pruritis, abdominal pain, fever, or urinary symptoms. New partner for 3 months. Desires STD Screening.   Exam: Appears well. External genitalia within normal limits. Speculum exam with mild bloody discharge and odor. No CMT or adnexal tenderness. Wet prep positive for many clues, TNTC bacteria. GC/Chlamydia culture taken.  Irregular bleeding 3 days Bacterial Vaginosis  STD screening  Plan: Flagyl 500 mg twice daily for 7 days. Overdue for annual, instructed to schedule. Encouraged MVI, condom use until permanent partner. Call office if bleeding persists cycles do not regulate. GC/Chlamydia, Hep B, Hep C, HIV, RPR.

## 2016-08-29 LAB — GC/CHLAMYDIA PROBE AMP
CT Probe RNA: NOT DETECTED
GC Probe RNA: NOT DETECTED

## 2016-08-29 LAB — RPR

## 2016-08-29 LAB — HIV ANTIBODY (ROUTINE TESTING W REFLEX): HIV 1&2 Ab, 4th Generation: NONREACTIVE

## 2016-08-29 LAB — HEPATITIS C ANTIBODY: HCV AB: NEGATIVE

## 2016-08-29 LAB — HEPATITIS B SURFACE ANTIGEN: HEP B S AG: NEGATIVE

## 2016-10-03 ENCOUNTER — Encounter: Payer: Self-pay | Admitting: Gynecology

## 2016-12-20 ENCOUNTER — Emergency Department (HOSPITAL_COMMUNITY): Payer: 59

## 2016-12-20 ENCOUNTER — Encounter (HOSPITAL_COMMUNITY): Payer: Self-pay

## 2016-12-20 ENCOUNTER — Emergency Department (HOSPITAL_COMMUNITY)
Admission: EM | Admit: 2016-12-20 | Discharge: 2016-12-21 | Disposition: A | Discharge status: Home / Self Care | Payer: 59 | Attending: Emergency Medicine | Admitting: Emergency Medicine

## 2016-12-20 DIAGNOSIS — S0990XA Unspecified injury of head, initial encounter: Secondary | ICD-10-CM | POA: Diagnosis not present

## 2016-12-20 DIAGNOSIS — Y929 Unspecified place or not applicable: Secondary | ICD-10-CM | POA: Diagnosis not present

## 2016-12-20 DIAGNOSIS — T07XXXA Unspecified multiple injuries, initial encounter: Secondary | ICD-10-CM

## 2016-12-20 DIAGNOSIS — Y9389 Activity, other specified: Secondary | ICD-10-CM | POA: Diagnosis not present

## 2016-12-20 DIAGNOSIS — S129XXA Fracture of neck, unspecified, initial encounter: Secondary | ICD-10-CM

## 2016-12-20 DIAGNOSIS — Y998 Other external cause status: Secondary | ICD-10-CM | POA: Diagnosis not present

## 2016-12-20 MED ORDER — MORPHINE SULFATE (PF) 4 MG/ML IV SOLN
4.0000 mg | Freq: Once | INTRAVENOUS | Status: AC
  Administered 2016-12-20: 4 mg via INTRAVENOUS
  Filled 2016-12-20: qty 1
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
## 2016-12-21 ENCOUNTER — Emergency Department (HOSPITAL_COMMUNITY): Payer: 59

## 2016-12-21 DIAGNOSIS — S129XXA Fracture of neck, unspecified, initial encounter: Secondary | ICD-10-CM | POA: Diagnosis not present

## 2016-12-21 LAB — I-STAT CHEM 8, ED
BUN: 8 mg/dL (ref 6–20)
Calcium, Ion: 1.12 mmol/L — ABNORMAL LOW (ref 1.15–1.40)
Chloride: 102 mmol/L (ref 101–111)
Creatinine, Ser: 0.8 mg/dL (ref 0.44–1.00)
GLUCOSE: 111 mg/dL — AB (ref 65–99)
HEMATOCRIT: 38 % (ref 36.0–46.0)
Hemoglobin: 12.9 g/dL (ref 12.0–15.0)
POTASSIUM: 3.9 mmol/L (ref 3.5–5.1)
Sodium: 140 mmol/L (ref 135–145)
TCO2: 25 mmol/L (ref 0–100)

## 2016-12-21 LAB — I-STAT BETA HCG BLOOD, ED (MC, WL, AP ONLY): I-stat hCG, quantitative: <5 m[IU]/mL (ref <5)

## 2016-12-21 LAB — CBC
HCT: 37.2 % (ref 36.0–46.0)
HEMOGLOBIN: 12.1 g/dL (ref 12.0–15.0)
MCH: 25.6 pg — ABNORMAL LOW (ref 26.0–34.0)
MCHC: 32.5 g/dL (ref 30.0–36.0)
MCV: 78.6 fL (ref 78.0–100.0)
Platelets: 267 10*3/uL (ref 150–400)
RBC: 4.73 MIL/uL (ref 3.87–5.11)
RDW: 12.8 % (ref 11.5–15.5)
WBC: 12.8 10*3/uL — ABNORMAL HIGH (ref 4.0–10.5)

## 2016-12-21 MED ORDER — ONDANSETRON 4 MG PO TBDP: 4.0000 mg | ORAL_TABLET | Freq: Three times a day (TID) | ORAL | 0 refills | Status: DC | PRN

## 2016-12-21 MED ORDER — DOCUSATE SODIUM 100 MG PO CAPS: 100.0000 mg | ORAL_CAPSULE | Freq: Two times a day (BID) | ORAL | 0 refills | Status: DC

## 2016-12-21 MED ORDER — MORPHINE SULFATE (PF) 4 MG/ML IV SOLN
4.0000 mg | Freq: Once | INTRAVENOUS | Status: AC
Start: 2016-12-21 — End: 2016-12-21
  Administered 2016-12-21: 4 mg via INTRAVENOUS
  Filled 2016-12-21: qty 1

## 2016-12-21 MED ORDER — ONDANSETRON 4 MG PO TBDP
4.0000 mg | ORAL_TABLET | Freq: Once | ORAL | Status: AC
  Administered 2016-12-21: 4 mg via ORAL
  Filled 2016-12-21: qty 1

## 2016-12-21 MED ORDER — IOPAMIDOL (ISOVUE-370) INJECTION 76%
INTRAVENOUS | Status: AC
  Administered 2016-12-21: 50 mL
  Filled 2016-12-21: qty 50

## 2016-12-21 MED ORDER — OXYCODONE-ACETAMINOPHEN 5-325 MG PO TABS: 1.0000 | ORAL_TABLET | Freq: Four times a day (QID) | ORAL | 0 refills | Status: DC | PRN

## 2016-12-21 MED ORDER — HYDROMORPHONE HCL 1 MG/ML IJ SOLN
1.0000 mg | Freq: Once | INTRAMUSCULAR | Status: AC
  Administered 2016-12-21: 1 mg via INTRAVENOUS
  Filled 2016-12-21: qty 1

## 2016-12-21 NOTE — ED Notes (Signed)
Pt placed in aspen collar. Assisted pt to side of bed. Pt ambulatory with one person assist. Reports dizziness and feeling "cross-eyed" (has received a total of 8mg  of morphine and 1mg  dilaudid).

## 2016-12-21 NOTE — ED Notes (Signed)
Discharged instructions explained to patient and family. Work note for 2 weeks given per verbal order from Dr.Ward.

## 2016-12-21 NOTE — ED Notes (Signed)
Apple Juice given 

## 2016-12-21 NOTE — Discharge Instructions (Signed)
To find a primary care or specialty doctor please call [phone] or [phone] to access "[place] Find a Doctor Service." ° °You may also go on the [facility] website at [email or web address] ° °There are also multiple [facility], [facility], [facility] and [facility] practices throughout the [place] that are frequently accepting new patients. You may find a clinic that is close to your home and contact them. ° °[facility] -  °[address] °[place] [address] [phone] ° ° °[facility] -  °[address] °[place] [address] [phone] ° ° °[facility] - °[address]  °[place] [address] °[phone] ° ° °
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
## 2016-12-21 NOTE — ED Notes (Signed)
While sitting in chair, pt nauseous and vomiting. ODT zofran given, ice pack applied to abdomen and cold washcloth placed to forehead

## 2016-12-21 NOTE — ED Notes (Signed)
Pt taken to CT.
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
## 2017-03-21 DIAGNOSIS — R87612 Low grade squamous intraepithelial lesion on cytologic smear of cervix (LGSIL): Secondary | ICD-10-CM

## 2017-03-21 HISTORY — DX: Low grade squamous intraepithelial lesion on cytologic smear of cervix (LGSIL): R87.612

## 2017-04-03 ENCOUNTER — Encounter: Payer: Self-pay | Admitting: Women's Health

## 2017-04-03 ENCOUNTER — Ambulatory Visit (INDEPENDENT_AMBULATORY_CARE_PROVIDER_SITE_OTHER): Payer: 59 | Admitting: Women's Health

## 2017-04-03 VITALS — BP 130/80 | Ht 62.0 in | Wt 216.0 lb

## 2017-04-03 DIAGNOSIS — Z01419 Encounter for gynecological examination (general) (routine) without abnormal findings: Secondary | ICD-10-CM

## 2017-04-03 LAB — CBC WITH DIFFERENTIAL/PLATELET
BASOS ABS: 28 {cells}/uL (ref 0–200)
Basophils Relative: 0.4 %
EOS PCT: 2.7 %
Eosinophils Absolute: 192 cells/uL (ref 15–500)
HCT: 37.6 % (ref 35.0–45.0)
Hemoglobin: 12.4 g/dL (ref 11.7–15.5)
LYMPHS ABS: 1782 {cells}/uL (ref 850–3900)
MCH: 25.7 pg — ABNORMAL LOW (ref 27.0–33.0)
MCHC: 33 g/dL (ref 32.0–36.0)
MCV: 77.8 fL — ABNORMAL LOW (ref 80.0–100.0)
MONOS PCT: 7.1 %
MPV: 10.6 fL (ref 7.5–12.5)
NEUTROS PCT: 64.7 %
Neutro Abs: 4594 cells/uL (ref 1500–7800)
PLATELETS: 315 10*3/uL (ref 140–400)
RBC: 4.83 10*6/uL (ref 3.80–5.10)
RDW: 12.7 % (ref 11.0–15.0)
TOTAL LYMPHOCYTE: 25.1 %
WBC mixed population: 504 cells/uL (ref 200–950)
WBC: 7.1 10*3/uL (ref 3.8–10.8)

## 2017-04-03 LAB — GLUCOSE, RANDOM: GLUCOSE: 88 mg/dL (ref 65–99)

## 2017-04-03 NOTE — Progress Notes (Signed)
Courtney Powell 12/01/1980 [id number]    History:    Presents for annual exam.  Regular monthly cycle/condoms. Not sexually active for the last 4-5 months, negative STD screen 08/2016 with last partner. Gardasil series completed. Normal Pap history.  Past medical history, past surgical history, family history and social history were all reviewed and documented in the EPIC chart. Cook at [employer].  ROS:  A ROS was performed and pertinent positives and negatives are included.  Exam:  Vitals:   04/03/17 1446  BP: 130/80  Weight: 216 lb (98 kg)  Height: 5\' 2"  (1.575 m)   Body mass index is 39.51 kg/m.   General appearance:  Normal Thyroid:  Symmetrical, normal in size, without palpable masses or nodularity. Respiratory  Auscultation:  Clear without wheezing or rhonchi Cardiovascular  Auscultation:  Regular rate, without rubs, murmurs or gallops  Edema/varicosities:  Not grossly evident Abdominal  Soft,nontender, without masses, guarding or rebound.  Liver/spleen:  No organomegaly noted  Hernia:  None appreciated  Skin  Inspection:  Grossly normal   Breasts: Examined lying and sitting.     Right: Without masses, retractions, discharge or axillary adenopathy.     Left: Without masses, retractions, discharge or axillary adenopathy. Gentitourinary   Inguinal/mons:  Normal without inguinal adenopathy  External genitalia:  Normal  BUS/Urethra/Skene's glands:  Normal  Vagina:  Normal  Cervix:  Normal  Uterus:   normal in size, shape and contour.  Midline and mobile  Adnexa/parametria:     Rt: Without masses or tenderness.   Lt: Without masses or tenderness.  Anus and perineum: Normal  Digital rectal exam: Normal sphincter tone without palpated masses or tenderness  Assessment/Plan:  36 y.o. SBF G1 P0  for annual exam with no complaints.  Monthly cycle Obesity  Plan: Contraception options reviewed and declined, condoms if active. SBE's, exercise, calcium rich diet, MVI  daily encouraged. Reviewed importance of increasing exercise and decreasing calories/carbs for weight loss. CBC, glucose, Pap with HR HPV typing, new screening guidelines reviewed.  Harrington Challengerancy J Natalin Bible [facility]NP, 3:34 PM 04/03/2017

## 2017-04-03 NOTE — Addendum Note (Signed)
Addended by: Tito DineBONHAM, KIM A on: 04/03/2017 03:40 PM   Modules accepted: Orders

## 2017-04-03 NOTE — Addendum Note (Signed)
Addended by: Tito DineBONHAM, KIM A on: 04/03/2017 03:59 PM   Modules accepted: Orders

## 2017-04-03 NOTE — Progress Notes (Signed)
[id number]  

## 2017-04-03 NOTE — Patient Instructions (Signed)
Carbohydrate Counting for Diabetes Mellitus, Adult Carbohydrate counting is a method for keeping track of how many carbohydrates you eat. Eating carbohydrates naturally increases the amount of sugar (glucose) in the blood. Counting how many carbohydrates you eat helps keep your blood glucose within normal limits, which helps you manage your diabetes (diabetes mellitus). It is important to know how many carbohydrates you can safely have in each meal. This is different for every person. A diet and nutrition specialist (registered dietitian) can help you make a meal plan and calculate how many carbohydrates you should have at each meal and snack. Carbohydrates are found in the following foods:  Grains, such as breads and cereals.  Dried beans and soy products.  Starchy vegetables, such as potatoes, peas, and corn.  Fruit and fruit juices.  Milk and yogurt.  Sweets and snack foods, such as cake, cookies, candy, chips, and soft drinks.  How do I count carbohydrates? There are two ways to count carbohydrates in food. You can use either of the methods or a combination of both. Reading "Nutrition Facts" on packaged food The "Nutrition Facts" list is included on the labels of almost all packaged foods and beverages in the [place] It includes:  The serving size.  Information about nutrients in each serving, including the grams (g) of carbohydrate per serving.  To use the "Nutrition Facts":  Decide how many servings you will have.  Multiply the number of servings by the number of carbohydrates per serving.  The resulting number is the total amount of carbohydrates that you will be having.  Learning standard serving sizes of other foods When you eat foods containing carbohydrates that are not packaged or do not include "Nutrition Facts" on the label, you need to measure the servings in order to count the amount of carbohydrates:  Measure the foods that you will eat with a food scale or  measuring cup, if needed.  Decide how many standard-size servings you will eat.  Multiply the number of servings by 15. Most carbohydrate-rich foods have about 15 g of carbohydrates per serving. ? For example, if you eat 8 oz (170 g) of strawberries, you will have eaten 2 servings and 30 g of carbohydrates (2 servings x 15 g = 30 g).  For foods that have more than one food mixed, such as soups and casseroles, you must count the carbohydrates in each food that is included.  The following list contains standard serving sizes of common carbohydrate-rich foods. Each of these servings has about 15 g of carbohydrates:   hamburger bun or  English muffin.   oz (15 mL) syrup.   oz (14 g) jelly.  1 slice of bread.  1 six-inch tortilla.  3 oz (85 g) cooked rice or pasta.  4 oz (113 g) cooked dried beans.  4 oz (113 g) starchy vegetable, such as peas, corn, or potatoes.  4 oz (113 g) hot cereal.  4 oz (113 g) mashed potatoes or  of a large baked potato.  4 oz (113 g) canned or frozen fruit.  4 oz (120 mL) fruit juice.  4-6 crackers.  6 chicken nuggets.  6 oz (170 g) unsweetened dry cereal.  6 oz (170 g) plain fat-free yogurt or yogurt sweetened with artificial sweeteners.  8 oz (240 mL) milk.  8 oz (170 g) fresh fruit or one small piece of fruit.  24 oz (680 g) popped popcorn.  Example of carbohydrate counting Sample meal  3 oz (85 g) chicken breast.    6 oz (170 g) brown rice.  4 oz (113 g) corn.  8 oz (240 mL) milk.  8 oz (170 g) strawberries with sugar-free whipped topping. Carbohydrate calculation 1. Identify the foods that contain carbohydrates: ? Rice. ? Corn. ? Milk. ? Strawberries. 2. Calculate how many servings you have of each food: ? 2 servings rice. ? 1 serving corn. ? 1 serving milk. ? 1 serving strawberries. 3. Multiply each number of servings by 15 g: ? 2 servings rice x 15 g = 30 g. ? 1 serving corn x 15 g = 15 g. ? 1 serving milk x 15  g = 15 g. ? 1 serving strawberries x 15 g = 15 g. 4. Add together all of the amounts to find the total grams of carbohydrates eaten: ? 30 g + 15 g + 15 g + 15 g = 75 g of carbohydrates total. This information is not intended to replace advice given to you by your health care provider. Make sure you discuss any questions you have with your health care provider. Document Released: 05/07/2005 Document Revised: 11/25/2015 Document Reviewed: 10/19/2015 Elsevier Interactive Patient Education  2018 [place] Maintenance, Female Adopting a healthy lifestyle and getting preventive care can go a long way to promote health and wellness. Talk with your health care provider about what schedule of regular examinations is right for you. This is a good chance for you to check in with your provider about disease prevention and staying healthy. In between checkups, there are plenty of things you can do on your own. Experts have done a lot of research about which lifestyle changes and preventive measures are most likely to keep you healthy. Ask your health care provider for more information. Weight and diet Eat a healthy diet  Be sure to include plenty of vegetables, fruits, low-fat dairy products, and lean protein.  Do not eat a lot of foods high in solid fats, added sugars, or salt.  Get regular exercise. This is one of the most important things you can do for your health. ? Most adults should exercise for at least 150 minutes each week. The exercise should increase your heart rate and make you sweat (moderate-intensity exercise). ? Most adults should also do strengthening exercises at least twice a week. This is in addition to the moderate-intensity exercise.  Maintain a healthy weight  Body mass index (BMI) is a measurement that can be used to identify possible weight problems. It estimates body fat based on height and weight. Your health care provider can help determine your BMI and help you  achieve or maintain a healthy weight.  For females 76 years of age and older: ? A BMI below 18.5 is considered underweight. ? A BMI of 18.5 to 24.9 is normal. ? A BMI of 25 to 29.9 is considered overweight. ? A BMI of 30 and above is considered obese.  Watch levels of cholesterol and blood lipids  You should start having your blood tested for lipids and cholesterol at 35 years of age, then have this test every 5 years.  You may need to have your cholesterol levels checked more often if: ? Your lipid or cholesterol levels are high. ? You are older than 35 years of age. ? You are at high risk for heart disease.  Cancer screening Lung Cancer  Lung cancer screening is recommended for adults 52-25 years old who are at high risk for lung cancer because of a history of smoking.  A  yearly low-dose CT scan of the lungs is recommended for people who: ? Currently smoke. ? Have quit within the past 15 years. ? Have at least a 30-pack-year history of smoking. A pack year is smoking an average of one pack of cigarettes a day for 1 year.  Yearly screening should continue until it has been 15 years since you quit.  Yearly screening should stop if you develop a health problem that would prevent you from having lung cancer treatment.  Breast Cancer  Practice breast self-awareness. This means understanding how your breasts normally appear and feel.  It also means doing regular breast self-exams. Let your health care provider know about any changes, no matter how small.  If you are in your 20s or 30s, you should have a clinical breast exam (CBE) by a health care provider every 1-3 years as part of a regular health exam.  If you are 4 or older, have a CBE every year. Also consider having a breast X-ray (mammogram) every year.  If you have a family history of breast cancer, talk to your health care provider about genetic screening.  If you are at high risk for breast cancer, talk to your health  care provider about having an MRI and a mammogram every year.  Breast cancer gene (BRCA) assessment is recommended for women who have family members with BRCA-related cancers. BRCA-related cancers include: ? Breast. ? Ovarian. ? Tubal. ? Peritoneal cancers.  Results of the assessment will determine the need for genetic counseling and BRCA1 and BRCA2 testing.  Cervical Cancer Your health care provider may recommend that you be screened regularly for cancer of the pelvic organs (ovaries, uterus, and vagina). This screening involves a pelvic examination, including checking for microscopic changes to the surface of your cervix (Pap test). You may be encouraged to have this screening done every 3 years, beginning at age 12.  For women ages 13-65, health care providers may recommend pelvic exams and Pap testing every 3 years, or they may recommend the Pap and pelvic exam, combined with testing for human papilloma virus (HPV), every 5 years. Some types of HPV increase your risk of cervical cancer. Testing for HPV may also be done on women of any age with unclear Pap test results.  Other health care providers may not recommend any screening for nonpregnant women who are considered low risk for pelvic cancer and who do not have symptoms. Ask your health care provider if a screening pelvic exam is right for you.  If you have had past treatment for cervical cancer or a condition that could lead to cancer, you need Pap tests and screening for cancer for at least 20 years after your treatment. If Pap tests have been discontinued, your risk factors (such as having a new sexual partner) need to be reassessed to determine if screening should resume. Some women have medical problems that increase the chance of getting cervical cancer. In these cases, your health care provider may recommend more frequent screening and Pap tests.  Colorectal Cancer  This type of cancer can be detected and often  prevented.  Routine colorectal cancer screening usually begins at 35 years of age and continues through 35 years of age.  Your health care provider may recommend screening at an earlier age if you have risk factors for colon cancer.  Your health care provider may also recommend using home test kits to check for hidden blood in the stool.  A small camera at the end of  a tube can be used to examine your colon directly (sigmoidoscopy or colonoscopy). This is done to check for the earliest forms of colorectal cancer.  Routine screening usually begins at age 45.  Direct examination of the colon should be repeated every 5-10 years through 35 years of age. However, you may need to be screened more often if early forms of precancerous polyps or small growths are found.  Skin Cancer  Check your skin from head to toe regularly.  Tell your health care provider about any new moles or changes in moles, especially if there is a change in a mole's shape or color.  Also tell your health care provider if you have a mole that is larger than the size of a pencil eraser.  Always use sunscreen. Apply sunscreen liberally and repeatedly throughout the day.  Protect yourself by wearing long sleeves, pants, a wide-brimmed hat, and sunglasses whenever you are outside.  Heart disease, diabetes, and high blood pressure  High blood pressure causes heart disease and increases the risk of stroke. High blood pressure is more likely to develop in: ? People who have blood pressure in the high end of the normal range (130-139/85-89 mm Hg). ? People who are overweight or obese. ? People who are African American.  If you are 65-44 years of age, have your blood pressure checked every 3-5 years. If you are 35 years of age or older, have your blood pressure checked every year. You should have your blood pressure measured twice-once when you are at a hospital or clinic, and once when you are not at a hospital or clinic.  Record the average of the two measurements. To check your blood pressure when you are not at a hospital or clinic, you can use: ? An automated blood pressure machine at a pharmacy. ? A home blood pressure monitor.  If you are between 55 years and 4 years old, ask your health care provider if you should take aspirin to prevent strokes.  Have regular diabetes screenings. This involves taking a blood sample to check your fasting blood sugar level. ? If you are at a normal weight and have a low risk for diabetes, have this test once every three years after 35 years of age. ? If you are overweight and have a high risk for diabetes, consider being tested at a younger age or more often. Preventing infection Hepatitis B  If you have a higher risk for hepatitis B, you should be screened for this virus. You are considered at high risk for hepatitis B if: ? You were born in a country where hepatitis B is common. Ask your health care provider which countries are considered high risk. ? Your parents were born in a high-risk country, and you have not been immunized against hepatitis B (hepatitis B vaccine). ? You have HIV or AIDS. ? You use needles to inject street drugs. ? You live with someone who has hepatitis B. ? You have had sex with someone who has hepatitis B. ? You get hemodialysis treatment. ? You take certain medicines for conditions, including cancer, organ transplantation, and autoimmune conditions.  Hepatitis C  Blood testing is recommended for: ? Everyone born from [birth year] through 1964. ? Anyone with known risk factors for hepatitis C.  Sexually transmitted infections (STIs)  You should be screened for sexually transmitted infections (STIs) including gonorrhea and chlamydia if: ? You are sexually active and are younger than 35 years of age. ? You are older than  35 years of age and your health care provider tells you that you are at risk for this type of infection. ? Your sexual  activity has changed since you were last screened and you are at an increased risk for chlamydia or gonorrhea. Ask your health care provider if you are at risk.  If you do not have HIV, but are at risk, it may be recommended that you take a prescription medicine daily to prevent HIV infection. This is called pre-exposure prophylaxis (PrEP). You are considered at risk if: ? You are sexually active and do not regularly use condoms or know the HIV status of your partner(s). ? You take drugs by injection. ? You are sexually active with a partner who has HIV.  Talk with your health care provider about whether you are at high risk of being infected with HIV. If you choose to begin PrEP, you should first be tested for HIV. You should then be tested every 3 months for as long as you are taking PrEP. Pregnancy  If you are premenopausal and you may become pregnant, ask your health care provider about preconception counseling.  If you may become pregnant, take 400 to 800 micrograms (mcg) of folic acid every day.  If you want to prevent pregnancy, talk to your health care provider about birth control (contraception). Osteoporosis and menopause  Osteoporosis is a disease in which the bones lose minerals and strength with aging. This can result in serious bone fractures. Your risk for osteoporosis can be identified using a bone density scan.  If you are 29 years of age or older, or if you are at risk for osteoporosis and fractures, ask your health care provider if you should be screened.  Ask your health care provider whether you should take a calcium or vitamin D supplement to lower your risk for osteoporosis.  Menopause may have certain physical symptoms and risks.  Hormone replacement therapy may reduce some of these symptoms and risks. Talk to your health care provider about whether hormone replacement therapy is right for you. Follow these instructions at home:  Schedule regular health, dental,  and eye exams.  Stay current with your immunizations.  Do not use any tobacco products including cigarettes, chewing tobacco, or electronic cigarettes.  If you are pregnant, do not drink alcohol.  If you are breastfeeding, limit how much and how often you drink alcohol.  Limit alcohol intake to no more than 1 drink per day for nonpregnant women. One drink equals 12 ounces of beer, 5 ounces of wine, or 1 ounces of hard liquor.  Do not use street drugs.  Do not share needles.  Ask your health care provider for help if you need support or information about quitting drugs.  Tell your health care provider if you often feel depressed.  Tell your health care provider if you have ever been abused or do not feel safe at home. This information is not intended to replace advice given to you by your health care provider. Make sure you discuss any questions you have with your health care provider. Document Released: 11/20/2010 Document Revised: 10/13/2015 Document Reviewed: 02/08/2015 Elsevier Interactive Patient Education  [employer].

## 2017-04-08 LAB — PAP IG W/ RFLX HPV ASCU

## 2017-04-29 ENCOUNTER — Ambulatory Visit: Payer: 59 | Admitting: Gynecology

## 2017-05-07 ENCOUNTER — Ambulatory Visit: Payer: 59 | Admitting: Gynecology

## 2017-05-17 ENCOUNTER — Ambulatory Visit (INDEPENDENT_AMBULATORY_CARE_PROVIDER_SITE_OTHER): Payer: 59 | Admitting: Gynecology

## 2017-05-17 ENCOUNTER — Encounter: Payer: Self-pay | Admitting: Gynecology

## 2017-05-17 VITALS — BP 132/80

## 2017-05-17 DIAGNOSIS — R87612 Low grade squamous intraepithelial lesion on cytologic smear of cervix (LGSIL): Secondary | ICD-10-CM

## 2017-05-17 NOTE — Progress Notes (Signed)
sur

## 2017-05-17 NOTE — Progress Notes (Signed)
    Courtney Coursererra Y Powell [birth year] [id number]        36 y.o.  G0P0000 presents having had her first abnormal Pap smear showing LGSIL.  Past medical history,surgical history, problem list, medications, allergies, family history and social history were all reviewed and documented in the EPIC chart.  Directed ROS with pertinent positives and negatives documented in the history of present illness/assessment and plan.  Exam: Bari MantisKim Alexis assistant Vitals:   05/17/17 1224  BP: 132/80   General appearance:  Normal Abdomen soft nontender without masses guarding rebound Pelvic external BUS vagina normal.  Cervix normal.  Uterus grossly normal size midline mobile there without masses or tenderness.  Colposcopy performed after acetic acid cleanse was adequate with acetowhite change noted 360 degrees around the transformation zone most prominent at the 6 o'clock position.  Representative biopsy taken.  ECC performed.  Patient tolerated well. Physical Exam  Genitourinary:       Assessment/Plan:  36 y.o. G0P0000 first abnormal Pap smear showing LGSIL.  Colposcopy shows acetowhite change along the entire transformation zone was prominent at 6 o'clock position.  Representative biopsy taken.  ECC performed.  Patient and I discussed dysplasia, high-grade/low-grade, progression/regression and the HPV association.  If biopsies are negative/low-grade then plan expectant management with follow-up Pap smear in 1 year.  If otherwise then will triaged based upon results.  Need for follow-up in the risks of progression to acute cervical cancer discussed.  Possible treatment options if high-grade dysplasia was also discussed.    Dara Lordsimothy P Holiday Mcmenamin MD, 1:04 PM 05/17/2017

## 2017-05-17 NOTE — Patient Instructions (Signed)
Office will call you with biopsy results 

## 2017-05-17 NOTE — Addendum Note (Signed)
Addended by: Rushie GoltzSPANGLER, Hamp Moreland on: 05/17/2017 04:49 PM   Modules accepted: Orders

## 2017-05-23 ENCOUNTER — Encounter: Payer: Self-pay | Admitting: Gynecology
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
## 2019-02-10 ENCOUNTER — Encounter: Payer: Self-pay | Admitting: Gynecology

## 2021-07-20 ENCOUNTER — Encounter: Payer: Self-pay | Admitting: Emergency Medicine

## 2021-07-20 ENCOUNTER — Ambulatory Visit
Admission: EM | Admit: 2021-07-20 | Discharge: 2021-07-20 | Disposition: A | Discharge status: Home / Self Care | Payer: BC Managed Care – PPO | Attending: Internal Medicine | Admitting: Internal Medicine

## 2021-07-20 DIAGNOSIS — R062 Wheezing: Secondary | ICD-10-CM

## 2021-07-20 DIAGNOSIS — R051 Acute cough: Secondary | ICD-10-CM

## 2021-07-20 MED ORDER — PREDNISONE 20 MG PO TABS: 40.0000 mg | ORAL_TABLET | Freq: Every day | ORAL | 0 refills | Status: AC

## 2021-07-20 MED ORDER — BENZONATATE 100 MG PO CAPS: 100.0000 mg | ORAL_CAPSULE | Freq: Three times a day (TID) | ORAL | 0 refills | Status: AC | PRN

## 2021-07-20 MED ORDER — ALBUTEROL SULFATE HFA 108 (90 BASE) MCG/ACT IN AERS: 1.0000 | INHALATION_SPRAY | Freq: Four times a day (QID) | RESPIRATORY_TRACT | 0 refills | Status: AC | PRN

## 2021-07-20 NOTE — ED Provider Notes (Addendum)
?[facility] ? ? ? ?CSN: [id number] ?Arrival date & time: 07/20/21  1851 ? ? ?  ? ?History   ?Chief Complaint ?Chief Complaint  ?Patient presents with  ? Cough  ? ? ?HPI ?Courtney Powell is a 41 y.o. female.  ? ?Patient presents for persistent cough and mild shortness of breath.  Patient reports that she tested positive for COVID approximately 2 weeks ago, and those symptoms have resolved.  She developed a new cough approximately 2 days ago with intermittent shortness of breath that occurs mainly with exertion.  Her mother has had similar symptoms recently but she denies any known fevers.  She does have a history of asthma but reports that she has not had any exacerbations since childhood.  Denies chest pain, nasal congestion, sore throat, ear pain, nausea, vomiting, diarrhea, abdominal pain.  Patient has taken Alka-Seltzer cold and flu with minimal improvement in symptoms. ? ? ?Cough ? ?Past Medical History:  ?Diagnosis Date  ? Dysmenorrhea   ? 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 Cervical biopsies showed koilocytotic atypia.  Recommend repeat Pap smear in 1 year  ? ? ?Patient Active Problem List  ? Diagnosis Date Noted  ? Morbid obesity (HCC) 04/03/2017  ? Gonorrhea 11/25/2014  ? ? ?Past Surgical History:  ?Procedure Laterality Date  ? NO PAST SURGERIES    ? ? ?OB History   ? ? Gravida  ?0  ? Para  ?0  ? Term  ?0  ? Preterm  ?0  ? AB  ?0  ? Living  ?0  ?  ? ? SAB  ?0  ? IAB  ?0  ? Ectopic  ?0  ? Multiple  ?0  ? Live Births  ?0  ?   ?  ?  ? ? ? ?Home Medications   ? ?Prior to Admission medications   ?Medication Sig Start Date End Date Taking? Authorizing Provider  ?albuterol (VENTOLIN HFA) 108 (90 Base) MCG/ACT inhaler Inhale 1-2 puffs into the lungs every 6 (six) hours as needed for wheezing or shortness of breath. 07/20/21  Yes Mayumi Summerson, Acie Fredrickson, FNP  ?benzonatate (TESSALON) 100 MG capsule Take 1 capsule (100 mg total) by mouth every 8 (eight) hours as needed for cough. 07/20/21  Yes Gustavus Bryant, FNP   ?predniSONE (DELTASONE) 20 MG tablet Take 2 tablets (40 mg total) by mouth daily for 5 days. 07/20/21 07/25/21 Yes Malone Admire, Acie Fredrickson, FNP  ?ibuprofen (ADVIL,MOTRIN) 200 MG tablet Take 800 mg by mouth every 6 (six) hours as needed for moderate pain.    [provider]  ? ? ?Family History ?Family History  ?Problem Relation Age of Onset  ? Kidney Stones Father   ? Diabetes Paternal Grandmother   ? ? ?Social History ?Social History  ? ?Tobacco Use  ? Smoking status: Never  ? Smokeless tobacco: Never  ?Vaping Use  ? Vaping Use: Never used  ?Substance Use Topics  ? Alcohol use: Yes  ?  Alcohol/week: 0.0 standard drinks  ?  Comment: mixed drinks in the last month  ? Drug use: No  ? ? ? ?Allergies   ?Shellfish allergy ? ? ?Review of Systems ?Review of Systems ?Per HPI ? ?Physical Exam ?Triage Vital Signs ?ED Triage Vitals  ?Enc Vitals Group  ?   BP 07/20/21 1858 132/83  ?   Pulse Rate 07/20/21 1858 97  ?   Resp 07/20/21 1858 20  ?   Temp 07/20/21 1858 98.2 ?F (36.8 ?C)  ?  Temp Source 07/20/21 1858 Oral  ?   SpO2 07/20/21 1858 98 %  ?   Weight --   ?   Height --   ?   Head Circumference --   ?   Peak Flow --   ?   Pain Score 07/20/21 1859 0  ?   Pain Loc --   ?   Pain Edu? --   ?   Excl. in GC? --   ? ?No data found. ? ?Updated Vital Signs ?BP 132/83 (BP Location: Left Arm)   Pulse 97   Temp 98.2 ?F (36.8 ?C) (Oral)   Resp 20   SpO2 98%  ? ?Visual Acuity ?Right Eye Distance:   ?Left Eye Distance:   ?Bilateral Distance:   ? ?Right Eye Near:   ?Left Eye Near:    ?Bilateral Near:    ? ?Physical Exam ?Constitutional:   ?   General: She is not in acute distress. ?   Appearance: Normal appearance. She is not toxic-appearing or diaphoretic.  ?HENT:  ?   Head: Normocephalic and atraumatic.  ?   Right Ear: Tympanic membrane and ear canal normal.  ?   Left Ear: Tympanic membrane and ear canal normal.  ?   Nose: Congestion present.  ?   Mouth/Throat:  ?   Mouth: Mucous membranes are moist.  ?   Pharynx: No posterior  oropharyngeal erythema.  ?Eyes:  ?   Extraocular Movements: Extraocular movements intact.  ?   Conjunctiva/sclera: Conjunctivae normal.  ?   Pupils: Pupils are equal, round, and reactive to light.  ?Cardiovascular:  ?   Rate and Rhythm: Normal rate and regular rhythm.  ?   Pulses: Normal pulses.  ?   Heart sounds: Normal heart sounds.  ?Pulmonary:  ?   Effort: Pulmonary effort is normal. No respiratory distress.  ?   Breath sounds: Wheezing present. No rales.  ?   Comments: Wheezing noted on the right upper lung. ?Abdominal:  ?   General: Abdomen is flat. Bowel sounds are normal.  ?   Palpations: Abdomen is soft.  ?Musculoskeletal:     ?   General: Normal range of motion.  ?   Cervical back: Normal range of motion.  ?Skin: ?   General: Skin is warm and dry.  ?Neurological:  ?   General: No focal deficit present.  ?   Mental Status: She is alert and oriented to person, place, and time. Mental status is at baseline.  ?Psychiatric:     ?   Mood and Affect: Mood normal.     ?   Behavior: Behavior normal.  ? ? ? ?UC Treatments / Results  ?Labs ?(all labs ordered are listed, but only abnormal results are displayed) ?Labs Reviewed - No data to display ? ?EKG ? ? ?Radiology ?No results found. ? ?Procedures ?Procedures (including critical care time) ? ?Medications Ordered in UC ?Medications - No data to display ? ?Initial Impression / Assessment and Plan / UC Course  ?I have reviewed the triage vital signs and the nursing notes. ? ?Pertinent labs & imaging results that were available during my care of the patient were reviewed by me and considered in my medical decision making (see chart for details). ? ?  ? ?Symptoms appear viral in etiology.  Patient does have mild wheezing which could indicate acute bronchitis or asthma exacerbation.  Will treat with albuterol inhaler and prednisone steroid.  Benzonatate prescribed to take as needed for cough.  Discussed return  precautions.  Do not think that chest imaging is necessary at  this time given that it appears to be inflammation related as opposed to pneumonia.  Patient verbalized understanding and was agreeable with plan. ?Final Clinical Impressions(s) / UC Diagnoses  ? ?Final diagnoses:  ?Acute cough  ?Wheezing  ? ? ? ?Discharge Instructions   ? ?  ?You have been sent 3 medications to help alleviate wheezing and shortness of breath.  Please follow-up if symptoms persist or worsen. ? ? ? ?ED Prescriptions   ? ? Medication Sig Dispense Auth. Provider  ? predniSONE (DELTASONE) 20 MG tablet Take 2 tablets (40 mg total) by mouth daily for 5 days. 10 tablet [place], [place] [address], [place]  ? benzonatate (TESSALON) 100 MG capsule Take 1 capsule (100 mg total) by mouth every 8 (eight) hours as needed for cough. 21 capsule [place], [place] [address], [place]  ? albuterol (VENTOLIN HFA) 108 (90 Base) MCG/ACT inhaler Inhale 1-2 puffs into the lungs every 6 (six) hours as needed for wheezing or shortness of breath. 1 each Gustavus Bryant, FNP  ? ?  ? ?PDMP not reviewed this encounter. ?  ?Gustavus Bryant, [place] ?07/20/21 1916 ? ?  ?Gustavus Bryant, [place] ?07/20/21 1916 ? ?

## 2021-07-20 NOTE — ED Triage Notes (Signed)
Tested positive for Covid 2 weeks ago. Continued cough with productive yellow mucus, began feeling mildly SOB yesterday ?

## 2021-07-20 NOTE — Discharge Instructions (Signed)
You have been sent 3 medications to help alleviate wheezing and shortness of breath.  Please follow-up if symptoms persist or worsen. ?
